# Patient Record
Sex: Female | Born: 1965 | Race: White | Hispanic: No | Marital: Married
Health system: Southern US, Community
[De-identification: ages and names within clinical notes are randomized; demographics above are authoritative.]

## PROBLEM LIST (undated history)

## (undated) DIAGNOSIS — E119 Type 2 diabetes mellitus without complications: Secondary | ICD-10-CM

---

## 2018-03-25 ENCOUNTER — Encounter (HOSPITAL_COMMUNITY): Payer: Self-pay | Admitting: Emergency Medicine

## 2018-03-25 ENCOUNTER — Emergency Department (HOSPITAL_COMMUNITY)
Admission: EM | Admit: 2018-03-25 | Discharge: 2018-03-26 | Disposition: A | Discharge status: Home / Self Care | Payer: BLUE CROSS/BLUE SHIELD | Attending: Emergency Medicine | Admitting: Emergency Medicine

## 2018-03-25 ENCOUNTER — Emergency Department (HOSPITAL_COMMUNITY): Payer: BLUE CROSS/BLUE SHIELD

## 2018-03-25 ENCOUNTER — Other Ambulatory Visit: Payer: Self-pay

## 2018-03-25 DIAGNOSIS — S99911A Unspecified injury of right ankle, initial encounter: Secondary | ICD-10-CM | POA: Diagnosis present

## 2018-03-25 DIAGNOSIS — S161XXA Strain of muscle, fascia and tendon at neck level, initial encounter: Secondary | ICD-10-CM | POA: Insufficient documentation

## 2018-03-25 DIAGNOSIS — E119 Type 2 diabetes mellitus without complications: Secondary | ICD-10-CM | POA: Insufficient documentation

## 2018-03-25 DIAGNOSIS — S9001XA Contusion of right ankle, initial encounter: Secondary | ICD-10-CM

## 2018-03-25 DIAGNOSIS — W2210XA Striking against or struck by unspecified automobile airbag, initial encounter: Secondary | ICD-10-CM | POA: Insufficient documentation

## 2018-03-25 DIAGNOSIS — S20219A Contusion of unspecified front wall of thorax, initial encounter: Secondary | ICD-10-CM | POA: Diagnosis not present

## 2018-03-25 DIAGNOSIS — Z79899 Other long term (current) drug therapy: Secondary | ICD-10-CM | POA: Insufficient documentation

## 2018-03-25 DIAGNOSIS — R1032 Left lower quadrant pain: Secondary | ICD-10-CM | POA: Insufficient documentation

## 2018-03-25 DIAGNOSIS — Z794 Long term (current) use of insulin: Secondary | ICD-10-CM | POA: Insufficient documentation

## 2018-03-25 DIAGNOSIS — Y999 Unspecified external cause status: Secondary | ICD-10-CM | POA: Insufficient documentation

## 2018-03-25 DIAGNOSIS — R0789 Other chest pain: Secondary | ICD-10-CM | POA: Diagnosis not present

## 2018-03-25 DIAGNOSIS — Y939 Activity, unspecified: Secondary | ICD-10-CM | POA: Diagnosis not present

## 2018-03-25 DIAGNOSIS — Y92410 Unspecified street and highway as the place of occurrence of the external cause: Secondary | ICD-10-CM | POA: Diagnosis not present

## 2018-03-25 DIAGNOSIS — S0990XA Unspecified injury of head, initial encounter: Secondary | ICD-10-CM | POA: Insufficient documentation

## 2018-03-25 HISTORY — DX: Type 2 diabetes mellitus without complications: E11.9
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
## 2018-03-26 ENCOUNTER — Emergency Department (HOSPITAL_COMMUNITY): Payer: BLUE CROSS/BLUE SHIELD

## 2018-03-26 DIAGNOSIS — S9001XA Contusion of right ankle, initial encounter: Secondary | ICD-10-CM | POA: Diagnosis not present

## 2018-03-26 LAB — COMPREHENSIVE METABOLIC PANEL
ALBUMIN: 3.7 g/dL (ref 3.5–5.0)
ALK PHOS: 80 U/L (ref 38–126)
ALT: 18 U/L (ref 14–54)
AST: 19 U/L (ref 15–41)
Anion gap: 9 (ref 5–15)
BUN: 11 mg/dL (ref 6–20)
CALCIUM: 9.1 mg/dL (ref 8.9–10.3)
CHLORIDE: 107 mmol/L (ref 101–111)
CO2: 22 mmol/L (ref 22–32)
CREATININE: 0.76 mg/dL (ref 0.44–1.00)
GFR calc non Af Amer: >60 mL/min (ref >60)
GLUCOSE: 150 mg/dL — AB (ref 65–99)
Potassium: 3.9 mmol/L (ref 3.5–5.1)
SODIUM: 138 mmol/L (ref 135–145)
Total Bilirubin: 0.6 mg/dL (ref 0.3–1.2)
Total Protein: 7.4 g/dL (ref 6.5–8.1)

## 2018-03-26 LAB — URINALYSIS, ROUTINE W REFLEX MICROSCOPIC
BILIRUBIN URINE: NEGATIVE
Glucose, UA: NEGATIVE mg/dL
KETONES UR: NEGATIVE mg/dL
Nitrite: NEGATIVE
PH: 5 (ref 5.0–8.0)
Protein, ur: NEGATIVE mg/dL
SPECIFIC GRAVITY, URINE: 1.027 (ref 1.005–1.030)

## 2018-03-26 LAB — PROTIME-INR
INR: 1.15
PROTHROMBIN TIME: 14.6 s (ref 11.4–15.2)

## 2018-03-26 LAB — CBC
HCT: 42.9 % (ref 36.0–46.0)
Hemoglobin: 14.4 g/dL (ref 12.0–15.0)
MCH: 30.1 pg (ref 26.0–34.0)
MCHC: 33.6 g/dL (ref 30.0–36.0)
MCV: 89.7 fL (ref 78.0–100.0)
PLATELETS: 326 10*3/uL (ref 150–400)
RBC: 4.78 MIL/uL (ref 3.87–5.11)
RDW: 12 % (ref 11.5–15.5)
WBC: 17.8 10*3/uL — ABNORMAL HIGH (ref 4.0–10.5)

## 2018-03-26 LAB — SAMPLE TO BLOOD BANK

## 2018-03-26 LAB — CDS SEROLOGY

## 2018-03-26 LAB — I-STAT CG4 LACTIC ACID, ED: LACTIC ACID, VENOUS: 2.01 mmol/L — AB (ref 0.5–1.9)

## 2018-03-26 LAB — ETHANOL

## 2018-03-26 LAB — I-STAT BETA HCG BLOOD, ED (MC, WL, AP ONLY): I-stat hCG, quantitative: <5 m[IU]/mL (ref <5)

## 2018-03-26 MED ORDER — METHOCARBAMOL 500 MG PO TABS
500.0000 mg | ORAL_TABLET | Freq: Once | ORAL | Status: AC
  Administered 2018-03-26: 500 mg via ORAL
  Filled 2018-03-26: qty 1

## 2018-03-26 MED ORDER — NAPROXEN 500 MG PO TABS: 500.0000 mg | ORAL_TABLET | Freq: Two times a day (BID) | ORAL | 0 refills | Status: AC

## 2018-03-26 MED ORDER — SODIUM CHLORIDE 0.9 % IV BOLUS
500.0000 mL | Freq: Once | INTRAVENOUS | Status: AC
  Administered 2018-03-26: 500 mL via INTRAVENOUS

## 2018-03-26 MED ORDER — MORPHINE SULFATE (PF) 4 MG/ML IV SOLN
4.0000 mg | Freq: Once | INTRAVENOUS | Status: AC
  Administered 2018-03-26: 4 mg via INTRAVENOUS
  Filled 2018-03-26: qty 1

## 2018-03-26 MED ORDER — METHOCARBAMOL 500 MG PO TABS: 500.0000 mg | ORAL_TABLET | Freq: Two times a day (BID) | ORAL | 0 refills | Status: AC

## 2018-03-26 MED ORDER — HYDROCODONE-ACETAMINOPHEN 5-325 MG PO TABS: 2.0000 | ORAL_TABLET | ORAL | 0 refills | Status: AC | PRN

## 2018-03-26 MED ORDER — OXYCODONE HCL 5 MG PO TABS
10.0000 mg | ORAL_TABLET | Freq: Once | ORAL | Status: AC
  Administered 2018-03-26: 10 mg via ORAL
  Filled 2018-03-26: qty 2

## 2018-03-26 MED ORDER — IOHEXOL 300 MG/ML  SOLN
100.0000 mL | Freq: Once | INTRAMUSCULAR | Status: AC | PRN
  Administered 2018-03-26: 100 mL via INTRAVENOUS

## 2018-03-26 NOTE — ED Notes (Signed)
Ortho tech paged  

## 2018-03-26 NOTE — Progress Notes (Signed)
Orthopedic Tech Progress Note Patient Details:  Donna Brewer 09/06/1966 [id number]  Ortho Devices Type of Ortho Device: ASO Ortho Device/Splint Location: rle Ortho Device/Splint Interventions: Ordered, Application, Adjustment   Post Interventions Patient Tolerated: Well Instructions Provided: Care of device, Adjustment of device   [name] Post 03/26/2018, 4:45 AM
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
## 2018-03-26 NOTE — Discharge Instructions (Addendum)
1. Medications: robaxin, naproxyn, vicodin, usual home medications 2. Treatment: rest, drink plenty of fluids, gentle stretching as discussed, alternate ice and heat 3. Follow Up: Please followup with your primary doctor in 3 days for discussion of your diagnoses and further evaluation after today's visit; if you do not have a primary care doctor use the resource guide provided to find one;  Return to the ER for worsening back pain, difficulty walking, loss of bowel or bladder control or other concerning symptoms    
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
